# Patient Record
Sex: Male | Born: 1990 | Race: Black or African American | Hispanic: No | Marital: Single | Smoking: Former smoker
Health system: Southern US, Community
[De-identification: ages and names within clinical notes are randomized; demographics above are authoritative.]

## PROBLEM LIST (undated history)

## (undated) DIAGNOSIS — C4490 Unspecified malignant neoplasm of skin, unspecified: Secondary | ICD-10-CM

## (undated) DIAGNOSIS — M24411 Recurrent dislocation, right shoulder: Secondary | ICD-10-CM

---
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
## 2011-10-28 ENCOUNTER — Inpatient Hospital Stay: Payer: Self-pay | Admitting: Surgery

## 2011-10-28 LAB — COMPREHENSIVE METABOLIC PANEL
Albumin: 4.1 g/dL (ref 3.4–5.0)
Alkaline Phosphatase: 60 U/L (ref 50–136)
BUN: 10 mg/dL (ref 7–18)
Bilirubin,Total: 0.6 mg/dL (ref 0.2–1.0)
Calcium, Total: 9.3 mg/dL (ref 8.5–10.1)
Chloride: 106 mmol/L (ref 98–107)
Co2: 23 mmol/L (ref 21–32)
Creatinine: 0.88 mg/dL (ref 0.60–1.30)
Glucose: 94 mg/dL (ref 65–99)
Osmolality: 278 (ref 275–301)
Potassium: 3.8 mmol/L (ref 3.5–5.1)
Sodium: 140 mmol/L (ref 136–145)
Total Protein: 7.3 g/dL (ref 6.4–8.2)

## 2011-10-28 LAB — CBC
HCT: 47 % (ref 40.0–52.0)
HGB: 15.4 g/dL (ref 13.0–18.0)
MCH: 29.3 pg (ref 26.0–34.0)
MCHC: 32.7 g/dL (ref 32.0–36.0)
Platelet: 221 10*3/uL (ref 150–440)
RBC: 5.25 10*6/uL (ref 4.40–5.90)
WBC: 17 10*3/uL — ABNORMAL HIGH (ref 3.8–10.6)

## 2011-10-28 LAB — URINALYSIS, COMPLETE
Bilirubin,UR: NEGATIVE
Ph: 8 (ref 4.5–8.0)
Protein: NEGATIVE
Specific Gravity: 1.015 (ref 1.003–1.030)
Squamous Epithelial: NONE SEEN

## 2011-10-28 LAB — DRUG SCREEN, URINE
Barbiturates, Ur Screen: NEGATIVE (ref ?–200)
MDMA (Ecstasy)Ur Screen: NEGATIVE (ref ?–500)
Phencyclidine (PCP) Ur S: NEGATIVE (ref ?–25)

## 2011-11-02 LAB — PATHOLOGY REPORT

## 2014-03-19 ENCOUNTER — Ambulatory Visit: Payer: Self-pay | Admitting: Physician Assistant

## 2014-03-23 ENCOUNTER — Ambulatory Visit: Payer: Self-pay | Admitting: Physician Assistant

## 2014-03-24 ENCOUNTER — Emergency Department: Payer: Self-pay | Admitting: Emergency Medicine

## 2014-03-25 ENCOUNTER — Emergency Department: Payer: Self-pay | Admitting: Emergency Medicine

## 2014-03-25 ENCOUNTER — Ambulatory Visit: Payer: Self-pay

## 2014-03-25 LAB — CBC WITH DIFFERENTIAL/PLATELET
Basophil #: 0.1 10*3/uL (ref 0.0–0.1)
Basophil %: 0.9 %
EOS ABS: 0.4 10*3/uL (ref 0.0–0.7)
EOS PCT: 4.3 %
HCT: 41.7 % (ref 40.0–52.0)
HGB: 13.6 g/dL (ref 13.0–18.0)
Lymphocyte #: 2.6 10*3/uL (ref 1.0–3.6)
Lymphocyte %: 25.8 %
MCH: 29.1 pg (ref 26.0–34.0)
MCHC: 32.7 g/dL (ref 32.0–36.0)
MCV: 89 fL (ref 80–100)
Monocyte #: 1.6 x10 3/mm — ABNORMAL HIGH (ref 0.2–1.0)
Monocyte %: 15.5 %
NEUTROS ABS: 5.5 10*3/uL (ref 1.4–6.5)
Neutrophil %: 53.5 %
Platelet: 198 10*3/uL (ref 150–440)
RBC: 4.68 10*6/uL (ref 4.40–5.90)
RDW: 13.7 % (ref 11.5–14.5)
WBC: 10.2 10*3/uL (ref 3.8–10.6)

## 2014-03-25 LAB — WOUND CULTURE

## 2014-06-03 ENCOUNTER — Encounter: Payer: Self-pay | Admitting: Surgery

## 2014-06-13 ENCOUNTER — Ambulatory Visit: Payer: Self-pay | Admitting: Surgery

## 2014-06-19 ENCOUNTER — Ambulatory Visit: Payer: Self-pay | Admitting: Surgery

## 2014-06-19 LAB — DRUG SCREEN, URINE
AMPHETAMINES, UR SCREEN: NEGATIVE (ref ?–1000)
Barbiturates, Ur Screen: NEGATIVE (ref ?–200)
Benzodiazepine, Ur Scrn: NEGATIVE (ref ?–200)
CANNABINOID 50 NG, UR ~~LOC~~: POSITIVE (ref ?–50)
Cocaine Metabolite,Ur ~~LOC~~: NEGATIVE (ref ?–300)
MDMA (Ecstasy)Ur Screen: NEGATIVE (ref ?–500)
Methadone, Ur Screen: NEGATIVE (ref ?–300)
Opiate, Ur Screen: NEGATIVE (ref ?–300)
Phencyclidine (PCP) Ur S: NEGATIVE (ref ?–25)
TRICYCLIC, UR SCREEN: NEGATIVE (ref ?–1000)

## 2014-11-02 NOTE — Op Note (Signed)
PATIENT NAME:  [name], Joshua Foley MR#:  [id number] DATE OF BIRTH:  04/04/1991  DATE OF PROCEDURE:  06/19/2014  PREOPERATIVE DIAGNOSIS:  Left skin lesion, lower extremity.   POSTOPERATIVE DIAGNOSIS:  Left skin lesion, lower extremity.   OPERATION: Wide excision of the left lower extremity skin lesion.   ANESTHESIA: General.   SURGEON: Rodena Goldmann III, MD.   OPERATIVE PROCEDURE: With the patient in the supine position after the induction of appropriate general anesthesia, the patient's left lower extremity was prepped with Betadine and draped in sterile towels.   An elliptical incision was made with a 2 cm margin. The mass was lifted without difficulty. Hemostasis was achieved with the Bovie. The specimen was swept off the fascia. The major veins were identified and avoided. Skin nerves were identified and avoided. The mass was marked and sent for permanent section.   Because of the extent of the excision and the fact that we are uncertain as to the etiology, I did not attempt a closure, anticipating a future skin graft. The wound was covered with Adaptic, wet cotton balls, dressing sponges, Kerlix, and an Ace wrap. The patient was return to the recovery room, having tolerated the procedure well. Sponge and needle counts were correct x2 in the operating room.     ____________________________ Rodena Goldmann III, MD rle:kl D: 06/19/2014 10:50:36 ET Foley: 06/19/2014 13:28:28 ET JOB#: [id number]  cc: Rodena Goldmann III, MD, <Dictator> Rodena Goldmann MD ELECTRONICALLY SIGNED 06/19/2014 16:47

## 2014-11-03 NOTE — H&P (Signed)
PATIENT NAME:  Joshua Foley, Joshua Foley MR#:  [id number] DATE OF BIRTH:  12/27/1990  DATE OF ADMISSION:  10/28/2011  PRIMARY CARE PHYSICIAN: None.   ADMITTING PHYSICIAN: Dr. Pat Patrick   CHIEF COMPLAINT: Abdominal pain.   BRIEF HISTORY: Joshua Foley is a 24 year old gentleman with a 12 to 18-hour history of abdominal pain. The pain began as a midepigastric periumbilical pain, which migrated to the right lower quadrant over the course of several hours. He woke in the middle of night with severe pain, mild nausea, but did not vomit. He denies any previous similar symptoms. He presented to the Emergency Room for further evaluation of unrelenting pain and white blood cell count was noted to be 17,000. The surgical service was consulted.   PAST MEDICAL HISTORY/ PAST SURGICAL HISTORY: He has no other major medical problems. He has no history of hepatitis, yellow jaundice, pancreatitis, peptic ulcer disease, gallbladder disease, or diverticulitis. No history of cardiac disease, hypertension, or diabetes. No previous surgery.   MEDICATIONS: He takes no medications regularly.   ALLERGIES: No known medical allergies.   SOCIAL HISTORY: He drinks alcohol on an occasional basis, smokes marijuana occasionally, and smokes a pack of cigarettes a day.   FAMILY HISTORY: Noncontributory.   REVIEW OF SYSTEMS: Otherwise unremarkable.   PHYSICAL EXAMINATION:  GENERAL: He is an alert, pleasant gentleman in no significant distress.   VITAL SIGNS: Stable. Blood pressure is 110/68, heart rate 84 and regular, and oxygen saturation is satisfactory.   HEENT: No scleral icterus and he has normal equally round pupils.   NECK: Supple without adenopathy. Trachea is midline.   CHEST: Clear with no adventitious sounds. He has normal pulmonary excursion.   CARDIAC: No murmurs or gallops to my ear. He seems to be in normal sinus rhythm.   ABDOMEN: Markedly tender in the right lower quadrant with point tenderness, rebound and  guarding, jiggle tenderness. No masses. No hernias. He does have hypoactive but present bowel sounds.   EXTREMITIES: Lower extremity exam reveals no deformity, full range of motion, and normal pulses.   PSYCHIATRIC: Normal affect and normal orientation.   IMPRESSION: This gentleman likely has acute appendicitis.  His clinical examination is very supportive and I do not think CT scan would be of any benefit.  If  the CT scan was not conclusive, I do not think it would dissuade me from wanting to consider laparoscopy. We talked to the family and the patient about this plan and they are in agreement. We will anticipate surgical intervention later this morning.    ____________________________ Micheline Maze, MD rle:bjt D: 10/28/2011 09:27:12 ET T: 10/28/2011 09:46:24 ET JOB#: [id number]  cc: Rodena Goldmann III, MD, <Dictator> Rodena Goldmann MD ELECTRONICALLY SIGNED 11/05/2011 22:20

## 2014-11-03 NOTE — Op Note (Signed)
PATIENT NAME:  Joshua Foley, Joshua Foley MR#:  [id number] DATE OF BIRTH:  [birth year]  DATE OF PROCEDURE:  10/28/2011  PREOPERATIVE DIAGNOSIS: Acute appendicitis.  POSTOPERATIVE DIAGNOSIS: Acute appendicitis.  OPERATION: Laparoscopic appendectomy.   SURGEON: Rodena Goldmann, III, MD   ANESTHESIA: General.   DESCRIPTION OF PROCEDURE: With the patient in supine position after induction of appropriate anesthesia, the patient's abdomen was prepped with ChloraPrep and draped with sterile towels. The patient was placed in head down, feet up position.  A small infraumbilical incision was made in standard fashion and carried down bluntly through subcutaneous tissue.  A Veress needle was used to cannulate the peritoneal cavity. CO2 was insufflated to appropriate pressure measurements. When approximately 2.5 liters of CO2 were instilled, the Veress needle was withdrawn. An 11 mm [employer] Port was inserted into the peritoneal cavity. A transverse mid epigastric incision was made, and an 11 mm port was inserted under direct vision. The right lower quadrant was investigated and suppurative appendicitis was encountered. There did not appear to be any evidence of appendiceal rupture. A transverse suprapubic incision was made. The bladder was quite distended, and care was taken to avoid any injury to the bladder. The camera was moved to the upper port and dissection carried out through the two lower ports. The mesoappendix was divided with three applications of the EndoGIA stapler carrying a white load. The base of the appendix was divided with a single application of the EndoGIA stapler carrying a blue load. The appendix was captured in an EndoCatch apparatus and removed without difficulty. The area was copiously suctioned and irrigated with 2 liters of warm saline solution. No significant bleeding was visualized. There did not appear to be evidence of significant inflammatory change remaining. The lower midline port was  removed and the defect closed with a needle passer using 0 Vicryl. The abdomen was then desufflated. All ports were withdrawn without difficulty. Skin incisions were closed with 5-0 nylon. The area was infiltrated with 0.25% Marcaine for postoperative pain control. Sterile dressings were applied. A Foley catheter was then placed in and out and about 450 mL of urine drained from the patient's bladder.   The patient was returned to the recovery room having tolerated the procedure well. Sponge, needle, and instrument counts were correct x2 in the Operating Room.    ____________________________ Rodena Goldmann III, MD rle:cbb D: 10/28/2011 13:59:40 ET T: 10/28/2011 16:53:01 ET JOB#: [id number]  cc: Rodena Goldmann III, MD, <Dictator> Rodena Goldmann MD ELECTRONICALLY SIGNED 11/05/2011 22:20

## 2014-11-04 LAB — SURGICAL PATHOLOGY

## 2015-01-13 ENCOUNTER — Emergency Department
Admission: EM | Admit: 2015-01-13 | Discharge: 2015-01-13 | Disposition: A | Discharge status: Home / Self Care | Payer: Managed Care, Other (non HMO) | Attending: Emergency Medicine | Admitting: Emergency Medicine

## 2015-01-13 ENCOUNTER — Emergency Department: Payer: Managed Care, Other (non HMO)

## 2015-01-13 ENCOUNTER — Encounter: Payer: Self-pay | Admitting: *Deleted

## 2015-01-13 DIAGNOSIS — S43014A Anterior dislocation of right humerus, initial encounter: Secondary | ICD-10-CM | POA: Diagnosis not present

## 2015-01-13 DIAGNOSIS — Y9289 Other specified places as the place of occurrence of the external cause: Secondary | ICD-10-CM | POA: Insufficient documentation

## 2015-01-13 DIAGNOSIS — Y9389 Activity, other specified: Secondary | ICD-10-CM | POA: Insufficient documentation

## 2015-01-13 DIAGNOSIS — S43034A Inferior dislocation of right humerus, initial encounter: Secondary | ICD-10-CM | POA: Insufficient documentation

## 2015-01-13 DIAGNOSIS — Y998 Other external cause status: Secondary | ICD-10-CM | POA: Insufficient documentation

## 2015-01-13 DIAGNOSIS — Z87828 Personal history of other (healed) physical injury and trauma: Secondary | ICD-10-CM

## 2015-01-13 DIAGNOSIS — Z720 Tobacco use: Secondary | ICD-10-CM | POA: Diagnosis not present

## 2015-01-13 DIAGNOSIS — S4991XA Unspecified injury of right shoulder and upper arm, initial encounter: Secondary | ICD-10-CM | POA: Diagnosis present

## 2015-01-13 DIAGNOSIS — Z8739 Personal history of other diseases of the musculoskeletal system and connective tissue: Secondary | ICD-10-CM

## 2015-01-13 DIAGNOSIS — S43004A Unspecified dislocation of right shoulder joint, initial encounter: Secondary | ICD-10-CM

## 2015-01-13 HISTORY — DX: Unspecified malignant neoplasm of skin, unspecified: C44.90

## 2015-01-13 MED ORDER — DIAZEPAM 2 MG PO TABS: 2.0000 mg | ORAL_TABLET | Freq: Three times a day (TID) | ORAL | Status: DC | PRN

## 2015-01-13 MED ORDER — ONDANSETRON 4 MG PO TBDP
4.0000 mg | ORAL_TABLET | Freq: Once | ORAL | Status: AC
  Administered 2015-01-13: 4 mg via ORAL

## 2015-01-13 MED ORDER — KETOROLAC TROMETHAMINE 10 MG PO TABS: 10.0000 mg | ORAL_TABLET | Freq: Three times a day (TID) | ORAL | Status: DC

## 2015-01-13 MED ORDER — MORPHINE SULFATE 2 MG/ML IJ SOLN
INTRAMUSCULAR | Status: AC
  Administered 2015-01-13: 2 mg via INTRAMUSCULAR
  Filled 2015-01-13: qty 1

## 2015-01-13 MED ORDER — MORPHINE SULFATE 2 MG/ML IJ SOLN
2.0000 mg | Freq: Once | INTRAMUSCULAR | Status: AC
  Administered 2015-01-13: 2 mg via INTRAMUSCULAR

## 2015-01-13 MED ORDER — OXYCODONE-ACETAMINOPHEN 5-325 MG PO TABS: 1.0000 | ORAL_TABLET | Freq: Three times a day (TID) | ORAL | Status: DC | PRN

## 2015-01-13 MED ORDER — ONDANSETRON 4 MG PO TBDP
ORAL_TABLET | ORAL | Status: AC
  Filled 2015-01-13: qty 1

## 2015-01-13 NOTE — ED Notes (Signed)
Right shoulder reduced by jenise pa-c.  Tolerated well.  meds given again.
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
## 2015-01-13 NOTE — Discharge Instructions (Signed)
Shoulder Dislocation Your shoulder is made up of three bones: the collar bone (clavicle); the shoulder blade (scapula), which includes the socket (glenoid cavity); and the upper arm bone (humerus). Your shoulder joint is the place where these bones meet. Strong, fibrous tissues hold these bones together (ligaments). Muscles and strong, fibrous tissues that connect the muscles to these bones (tendons) allow your arm to move through this joint. The range of motion of your shoulder joint is more extensive than most of your other joints, and the glenoid cavity is very shallow. That is the reason that your shoulder joint is one of the most unstable joints in your body. It is far more prone to dislocation than your other joints. Shoulder dislocation is when your humerus is forced out of your shoulder joint. CAUSES Shoulder dislocation is caused by a forceful impact on your shoulder. This impact usually is from an injury, such as a sports injury or a fall. SYMPTOMS Symptoms of shoulder dislocation include:  Deformity of your shoulder.  Intense pain.  Inability to move your shoulder joint.  Numbness, weakness, or tingling around your shoulder joint (your neck or down your arm).  Bruising or swelling around your shoulder. DIAGNOSIS In order to diagnose a dislocated shoulder, your caregiver will perform a physical exam. Your caregiver also may have an X-ray exam done to see if you have any broken bones. Magnetic resonance imaging (MRI) is a procedure that sometimes is done to help your caregiver see any damage to the soft tissues around your shoulder, particularly your rotator cuff tendons. Additionally, your caregiver also may have electromyography done to measure the electrical discharges produced in your muscles if you have signs or symptoms of nerve damage. TREATMENT A shoulder dislocation is treated by placing the humerus back in the joint (reduction). Your caregiver does this either manually (closed  reduction), by moving your humerus back into the joint through manipulation, or through surgery (open reduction). When your humerus is back in place, severe pain should improve almost immediately. You also may need to have surgery if you have a weak shoulder joint or ligaments, and you have recurring shoulder dislocations, despite rehabilitation. In rare cases, surgery is necessary if your nerves or blood vessels are damaged during the dislocation. After your reduction, your arm will be placed in a shoulder immobilizer or sling to keep it from moving. Your caregiver will have you wear your shoulder immobilizer or sling for 3 days to 3 weeks, depending on how serious your dislocation is. When your shoulder immobilizer or sling is removed, your caregiver may prescribe physical therapy to help improve the range of motion in your shoulder joint. HOME CARE INSTRUCTIONS  The following measures can help to reduce pain and speed up the healing process:  Rest your injured joint. Do not move it. Avoid activities similar to the one that caused your injury.  Apply ice to your injured joint for the first day or two after your reduction or as directed by your caregiver. Applying ice helps to reduce inflammation and pain.  Put ice in a plastic bag.  Place a towel between your skin and the bag.  Leave the ice on for 15-20 minutes at a time, every 2 hours while you are awake.  Exercise your hand by squeezing a soft ball. This helps to eliminate stiffness and swelling in your hand and wrist.  Take over-the-counter or prescription medicine for pain or discomfort as told by your caregiver. SEEK IMMEDIATE MEDICAL CARE IF:   Your  shoulder immobilizer or sling becomes damaged.  Your pain becomes worse rather than better.  You lose feeling in your arm or hand, or they become white and cold. MAKE SURE YOU:   Understand these instructions.  Will watch your condition.  Will get help right away if you are not  doing well or get worse. Document Released: 03/23/2001 Document Revised: 11/12/2013 Document Reviewed: 04/18/2011 [facility] Patient Information 2015 [place], [place]. This information is not intended to replace advice given to you by your health care provider. Make sure you discuss any questions you have with your health care provider.  Your shoulder was successfully reduced after it was traumatically dislocated today.  Take the pain medicines as directed.  Apply regularly, and remain in the sling when out of bed. Follow-up with Ortho next week for further care and management.
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
## 2016-12-22 ENCOUNTER — Emergency Department: Payer: Self-pay

## 2016-12-22 ENCOUNTER — Emergency Department
Admission: EM | Admit: 2016-12-22 | Discharge: 2016-12-22 | Disposition: A | Discharge status: Home / Self Care | Payer: Self-pay | Attending: Emergency Medicine | Admitting: Emergency Medicine

## 2016-12-22 ENCOUNTER — Encounter: Payer: Self-pay | Admitting: *Deleted

## 2016-12-22 DIAGNOSIS — Y929 Unspecified place or not applicable: Secondary | ICD-10-CM | POA: Insufficient documentation

## 2016-12-22 DIAGNOSIS — X509XXA Other and unspecified overexertion or strenuous movements or postures, initial encounter: Secondary | ICD-10-CM | POA: Insufficient documentation

## 2016-12-22 DIAGNOSIS — Y999 Unspecified external cause status: Secondary | ICD-10-CM | POA: Insufficient documentation

## 2016-12-22 DIAGNOSIS — F17200 Nicotine dependence, unspecified, uncomplicated: Secondary | ICD-10-CM | POA: Insufficient documentation

## 2016-12-22 DIAGNOSIS — Z791 Long term (current) use of non-steroidal anti-inflammatories (NSAID): Secondary | ICD-10-CM | POA: Insufficient documentation

## 2016-12-22 DIAGNOSIS — Z85828 Personal history of other malignant neoplasm of skin: Secondary | ICD-10-CM | POA: Insufficient documentation

## 2016-12-22 DIAGNOSIS — Y939 Activity, unspecified: Secondary | ICD-10-CM | POA: Insufficient documentation

## 2016-12-22 DIAGNOSIS — S43014A Anterior dislocation of right humerus, initial encounter: Secondary | ICD-10-CM | POA: Insufficient documentation

## 2016-12-22 MED ORDER — HYDROCODONE-ACETAMINOPHEN 5-325 MG PO TABS: 1.0000 | ORAL_TABLET | ORAL | 0 refills | Status: DC | PRN

## 2016-12-22 MED ORDER — ONDANSETRON 4 MG PO TBDP
ORAL_TABLET | ORAL | Status: AC
  Filled 2016-12-22: qty 1

## 2016-12-22 MED ORDER — ONDANSETRON 4 MG PO TBDP
4.0000 mg | ORAL_TABLET | Freq: Once | ORAL | Status: AC | PRN
  Administered 2016-12-22: 4 mg via ORAL

## 2016-12-22 MED ORDER — HYDROMORPHONE HCL 1 MG/ML IJ SOLN
1.0000 mg | Freq: Once | INTRAMUSCULAR | Status: AC
  Administered 2016-12-22: 1 mg via INTRAMUSCULAR
  Filled 2016-12-22: qty 1

## 2016-12-22 NOTE — ED Provider Notes (Signed)
[facility] Emergency Department Provider Note  Time seen: 10:27 AM  I have reviewed the triage vital signs and the nursing notes.   HISTORY  Chief Complaint Shoulder Pain    HPI Joshua Foley is a 26 y.o. male with no past medical history who presents to the emergency department with a right shoulder dislocation. According to the patient he was stretching this morning when he felt a pop followed by significant pain in the right shoulder. Patient states a history of a prior dislocation 2 years ago. Denies any trauma. States severe pain in the right shoulder, otherwise negative review of systems.  Past Medical History:  Diagnosis Date  . Skin cancer     There are no active problems to display for this patient.   History reviewed. No pertinent surgical history.  Prior to Admission medications   Medication Sig Start Date End Date Taking? Authorizing Provider  diazepam (VALIUM) 2 MG tablet Take 1 tablet (2 mg total) by mouth every 8 (eight) hours as needed for muscle spasms. 01/13/15   Menshew, Dannielle Karvonen, PA-C  ketorolac (TORADOL) 10 MG tablet Take 1 tablet (10 mg total) by mouth every 8 (eight) hours. 01/13/15   Menshew, Dannielle Karvonen, PA-C  oxyCODONE-acetaminophen (ROXICET) 5-325 MG per tablet Take 1 tablet by mouth every 8 (eight) hours as needed. 01/13/15   Menshew, Dannielle Karvonen, PA-C    No Known Allergies  History reviewed. No pertinent family history.  Social History Social History  Substance Use Topics  . Smoking status: Current Every Day Smoker  . Smokeless tobacco: Not on file  . Alcohol use No    Review of Systems Constitutional: Negative for fever. Cardiovascular: Negative for chest pain. Respiratory: Negative for shortness of breath. Gastrointestinal: Negative for abdominal pain Genitourinary: Negative for dysuria. Musculoskeletal: Right shoulder pain Skin: Negative for rash. Neurological: Negative for headache All other ROS  negative  ____________________________________________   PHYSICAL EXAM:  VITAL SIGNS: ED Triage Vitals  Enc Vitals Group     BP 12/22/16 1016 (!) 146/62     Pulse Rate 12/22/16 1016 (!) 54     Resp 12/22/16 1016 16     Temp 12/22/16 1016 98.4 F (36.9 C)     Temp Source 12/22/16 1016 Oral     SpO2 12/22/16 1016 100 %     Weight 12/22/16 1014 200 lb (90.7 kg)     Height 12/22/16 1014 5\' 11"  (1.803 m)     Head Circumference --      Peak Flow --      Pain Score 12/22/16 1014 10     Pain Loc --      Pain Edu? --      Excl. in [place]? --     Constitutional: Alert and oriented. Mild distress due to pain. Eyes: Normal exam ENT   Head: Normocephalic and atraumatic.   Mouth/Throat: Mucous membranes are moist. Cardiovascular: Normal rate, regular rhythm. No murmur Respiratory: Normal respiratory effort without tachypnea nor retractions. Breath sounds are clear Gastrointestinal: Soft and nontender. No distention.  Musculoskeletal: Right shoulder pain with deformity/flattening, most consistent with anterior dislocation. Neurovascular intact distally Neurologic:  Normal speech and language. No gross focal neurologic deficits Skin:  Skin is warm, mild diaphoresis. Psychiatric: Mood and affect are normal  ____________________________________________     RADIOLOGY  X-ray confirms reduction. Possible Hill-Sachs lesion.  ____________________________________________   INITIAL IMPRESSION / ASSESSMENT AND PLAN / ED COURSE  Pertinent labs & imaging results  that were available during my care of the patient were reviewed by me and considered in my medical decision making (see chart for details).  Patient presents to the emergency department for right shoulder pain after stretching this morning, exam most consistent with anterior shoulder dislocation. Neurovascularly intact. Patient placed in a prone position on the stretcher, traction was applied to the right arm with scapular  manipulation and successful reduction. We will treat the patient's pain with IM Dilaudid, we'll obtain an x-ray to confirm successful reduction. Patient states he feels much better after reduction. Remains neurovascularly intact. We will place in a shoulder immobilizer.  Reduction of dislocation Date/Time: 10:30 AM Performed by: Harvest Dark Authorized by: Harvest Dark Consent: Verbal consent obtained. Risks and benefits: risks, benefits and alternatives were discussed Consent given by: patient Required items: required blood products, implants, devices, and special equipment available Time out: Immediately prior to procedure a "time out" was called to verify the correct patient, procedure, equipment, support staff and site/side marked as required.  Patient sedated: No  Vitals: Vital signs were monitored during sedation. Patient tolerance: Patient tolerated the procedure well with no immediate complications. Joint: Right shoulder Reduction technique: traction with scapular manipulation   X-ray confirms reduction. We will discharge and a shoulder immobilizer with pain medication and have the patient follow up with orthopedics in 1 week for recheck. ____________________________________________   FINAL CLINICAL IMPRESSION(S) / ED DIAGNOSES  Right shoulder dislocation    Harvest Dark, MD 12/22/16 1130

## 2016-12-22 NOTE — Discharge Instructions (Signed)
Please follow-up with orthopedics in 1 week for recheck/reevaluation. Please keep your arm in the shoulder immobilizer. He may take it off to shower, etc., however do not raise your arm past 90. Please take your pain medication as needed, as prescribed. Return to the emergency department for any increase in pain, or any other symptom personally concerning to yourself.

## 2016-12-22 NOTE — ED Triage Notes (Signed)
Pt states he was stretching this AM and heard his shoulder pop, states hx of dislocation last year

## 2016-12-22 NOTE — ED Notes (Signed)
Patient transported to X-ray 

## 2017-01-05 ENCOUNTER — Other Ambulatory Visit: Payer: Self-pay | Admitting: Orthopedic Surgery

## 2017-01-05 DIAGNOSIS — S43014A Anterior dislocation of right humerus, initial encounter: Secondary | ICD-10-CM

## 2017-01-13 ENCOUNTER — Ambulatory Visit: Payer: PRIVATE HEALTH INSURANCE

## 2017-01-13 ENCOUNTER — Ambulatory Visit: Payer: Managed Care, Other (non HMO)

## 2018-03-04 ENCOUNTER — Emergency Department: Payer: Commercial Managed Care - HMO

## 2018-03-04 ENCOUNTER — Encounter: Payer: Self-pay | Admitting: Emergency Medicine

## 2018-03-04 ENCOUNTER — Other Ambulatory Visit: Payer: Self-pay

## 2018-03-04 ENCOUNTER — Emergency Department
Admission: EM | Admit: 2018-03-04 | Discharge: 2018-03-04 | Disposition: A | Discharge status: Home / Self Care | Payer: Commercial Managed Care - HMO | Attending: Emergency Medicine | Admitting: Emergency Medicine

## 2018-03-04 DIAGNOSIS — F17210 Nicotine dependence, cigarettes, uncomplicated: Secondary | ICD-10-CM | POA: Diagnosis not present

## 2018-03-04 DIAGNOSIS — Y9389 Activity, other specified: Secondary | ICD-10-CM | POA: Insufficient documentation

## 2018-03-04 DIAGNOSIS — Y92003 Bedroom of unspecified non-institutional (private) residence as the place of occurrence of the external cause: Secondary | ICD-10-CM | POA: Diagnosis not present

## 2018-03-04 DIAGNOSIS — Z79899 Other long term (current) drug therapy: Secondary | ICD-10-CM | POA: Diagnosis not present

## 2018-03-04 DIAGNOSIS — Y999 Unspecified external cause status: Secondary | ICD-10-CM | POA: Diagnosis not present

## 2018-03-04 DIAGNOSIS — S4991XA Unspecified injury of right shoulder and upper arm, initial encounter: Secondary | ICD-10-CM | POA: Diagnosis present

## 2018-03-04 DIAGNOSIS — S43014A Anterior dislocation of right humerus, initial encounter: Secondary | ICD-10-CM | POA: Insufficient documentation

## 2018-03-04 DIAGNOSIS — X58XXXA Exposure to other specified factors, initial encounter: Secondary | ICD-10-CM | POA: Diagnosis not present

## 2018-03-04 MED ORDER — MORPHINE SULFATE (PF) 4 MG/ML IV SOLN
INTRAVENOUS | Status: AC
  Filled 2018-03-04: qty 1

## 2018-03-04 MED ORDER — TRAMADOL HCL 50 MG PO TABS: 50.0000 mg | ORAL_TABLET | Freq: Four times a day (QID) | ORAL | 0 refills | Status: AC | PRN

## 2018-03-04 MED ORDER — ONDANSETRON HCL 4 MG/2ML IJ SOLN
4.0000 mg | Freq: Once | INTRAMUSCULAR | Status: AC
  Administered 2018-03-04: 4 mg via INTRAVENOUS

## 2018-03-04 MED ORDER — ONDANSETRON HCL 4 MG/2ML IJ SOLN
INTRAMUSCULAR | Status: AC
  Filled 2018-03-04: qty 2

## 2018-03-04 MED ORDER — MORPHINE SULFATE (PF) 4 MG/ML IV SOLN
4.0000 mg | Freq: Once | INTRAVENOUS | Status: AC
  Administered 2018-03-04: 4 mg via INTRAVENOUS

## 2018-03-04 NOTE — ED Notes (Signed)
MD Owens Shark to bedside to reduce arm

## 2018-03-04 NOTE — ED Notes (Signed)
This RN reviewed discharge instructions, follow-up care, prescription, cryotherapy, and sling use with patient. Patient verbalized understanding of all reviewed information.  Patient stable, with no distress noted at this time.

## 2018-03-04 NOTE — ED Provider Notes (Signed)
[facility] Emergency Department Provider Note   First MD Initiated Contact with Patient 03/04/18 [phone]     (approximate)  I have reviewed the triage vital signs and the nursing notes.   HISTORY  Chief Complaint Shoulder Pain    HPI Joshua Foley is a 27 y.o. male issue of right shoulder dislocation presents to the emergency department acute onset of 10 out of 10 right shoulder pain and deformity while rolling over in bed tonight.  Patient states symptoms consistent with previous shoulder dislocation.   Past Medical History:  Diagnosis Date  . Skin cancer     There are no active problems to display for this patient.   History reviewed. No pertinent surgical history.  Prior to Admission medications   Medication Sig Start Date End Date Taking? Authorizing Provider  diazepam (VALIUM) 2 MG tablet Take 1 tablet (2 mg total) by mouth every 8 (eight) hours as needed for muscle spasms. 01/13/15   Menshew, Dannielle Karvonen, PA-C  HYDROcodone-acetaminophen (NORCO/VICODIN) 5-325 MG tablet Take 1 tablet by mouth every 4 (four) hours as needed. 12/22/16   Harvest Dark, MD  ketorolac (TORADOL) 10 MG tablet Take 1 tablet (10 mg total) by mouth every 8 (eight) hours. 01/13/15   Menshew, Dannielle Karvonen, PA-C  oxyCODONE-acetaminophen (ROXICET) 5-325 MG per tablet Take 1 tablet by mouth every 8 (eight) hours as needed. 01/13/15   Menshew, Dannielle Karvonen, PA-C    Allergies No known drug allergies History reviewed. No pertinent family history.  Social History Social History   Tobacco Use  . Smoking status: Current Every Day Smoker  Substance Use Topics  . Alcohol use: No  . Drug use: Not on file    Review of Systems Constitutional: No fever/chills Eyes: No visual changes. ENT: No sore throat. Cardiovascular: Denies chest pain. Respiratory: Denies shortness of breath. Gastrointestinal: No abdominal pain.  No nausea, no vomiting.  No diarrhea.  No  constipation. Genitourinary: Negative for dysuria. Musculoskeletal: Negative for neck pain.  Negative for back pain.  Positive for right shoulder pain Integumentary: Negative for rash. Neurological: Negative for headaches, focal weakness or numbness.   ____________________________________________   PHYSICAL EXAM:  VITAL SIGNS: ED Triage Vitals  Enc Vitals Group     BP 03/04/18 0436 120/68     Pulse Rate 03/04/18 0436 60     Resp 03/04/18 0436 18     Temp 03/04/18 0436 98.3 F (36.8 C)     Temp Source 03/04/18 0436 Oral     SpO2 03/04/18 0436 99 %     Weight 03/04/18 0437 102.1 kg (225 lb)     Height 03/04/18 0437 1.803 m (5\' 11" )     Head Circumference --      Peak Flow --      Pain Score 03/04/18 0437 8     Pain Loc --      Pain Edu? --      Excl. in [place]? --     Constitutional: Alert and oriented.  Apparent discomfort Eyes: Conjunctivae are normal.  Head: Atraumatic. Mouth/Throat: Mucous membranes are moist. Neck: No stridor.  Cardiovascular: Normal rate, regular rhythm. Good peripheral circulation. Grossly normal heart sounds. Respiratory: Normal respiratory effort.  No retractions. Lungs CTAB.  Musculoskeletal: Gross deformity of the right shoulder pain to motion limited secondary to pain. Neurologic:  Normal speech and language. No gross focal neurologic deficits are appreciated.  Skin:  Skin is warm, dry and intact. No rash noted.  Psychiatric: Mood and affect are normal. Speech and behavior are normal.  ________________ RADIOLOGY I, Tiburones N Sylas Twombly, personally viewed and evaluated these images (plain radiographs) as part of my medical decision making, as well as reviewing the written report by the radiologist.  ED MD interpretation: Right shoulder x-ray revealed anterior inferior shoulder dislocation  Official radiology report(s): No results found.    Reduction of dislocation Date/Time: 03/04/2018 5:19 AM Performed by: Gregor Hams, MD Authorized by:  Gregor Hams, MD  Consent: Verbal consent obtained. Consent given by: patient Patient identity confirmed: verbally with patient and arm band Local anesthesia used: no  Anesthesia: Local anesthesia used: no  Sedation: Patient sedated: no  Patient tolerance: Patient tolerated the procedure well with no immediate complications Comments: Shoulder reduction performed via scapular manipulation.      ____________________________________________   INITIAL IMPRESSION / ASSESSMENT AND PLAN / ED COURSE  As part of my medical decision making, I reviewed the following data within the electronic MEDICAL RECORD NUMBER   27 year old male presented with above-stated history and physical exam secondary to right shoulder dislocation.  Shoulder reduced via scapular manipulation without any difficulty.  Patient was given IV morphine 4 mg and for reduction.  ___________________________________  FINAL CLINICAL IMPRESSION(S) / ED DIAGNOSES  Final diagnoses:  Anterior dislocation of right shoulder, initial encounter     MEDICATIONS GIVEN DURING THIS VISIT:  Medications  morphine 4 MG/ML injection 4 mg (4 mg Intravenous Given 03/04/18 0458)  ondansetron (ZOFRAN) injection 4 mg (4 mg Intravenous Given 03/04/18 0458)     ED Discharge Orders    None       Note:  This document was prepared using Dragon voice recognition software and may include unintentional dictation errors.    Gregor Hams, MD 03/04/18 [phone]

## 2018-03-04 NOTE — ED Triage Notes (Signed)
Pt ambulatory to triage with no difficulty. Pt reports approximately 45 mins ago he rolled over in the bed and felt pain. Deformity to right shoulder noted. Hx of dislocation x 2 in the past.

## 2018-06-29 DIAGNOSIS — L209 Atopic dermatitis, unspecified: Secondary | ICD-10-CM | POA: Insufficient documentation

## 2019-09-13 DIAGNOSIS — S12600D Unspecified displaced fracture of seventh cervical vertebra, subsequent encounter for fracture with routine healing: Secondary | ICD-10-CM | POA: Insufficient documentation

## 2020-01-26 ENCOUNTER — Encounter: Payer: Self-pay | Admitting: Emergency Medicine

## 2020-01-26 ENCOUNTER — Emergency Department: Payer: 59

## 2020-01-26 ENCOUNTER — Other Ambulatory Visit: Payer: Self-pay

## 2020-01-26 ENCOUNTER — Emergency Department
Admission: EM | Admit: 2020-01-26 | Discharge: 2020-01-26 | Disposition: A | Discharge status: Home / Self Care | Payer: 59 | Attending: Emergency Medicine | Admitting: Emergency Medicine

## 2020-01-26 DIAGNOSIS — S43014A Anterior dislocation of right humerus, initial encounter: Secondary | ICD-10-CM | POA: Diagnosis not present

## 2020-01-26 DIAGNOSIS — Y999 Unspecified external cause status: Secondary | ICD-10-CM | POA: Insufficient documentation

## 2020-01-26 DIAGNOSIS — Y939 Activity, unspecified: Secondary | ICD-10-CM | POA: Diagnosis not present

## 2020-01-26 DIAGNOSIS — Y92410 Unspecified street and highway as the place of occurrence of the external cause: Secondary | ICD-10-CM | POA: Insufficient documentation

## 2020-01-26 DIAGNOSIS — Z87891 Personal history of nicotine dependence: Secondary | ICD-10-CM | POA: Insufficient documentation

## 2020-01-26 DIAGNOSIS — S4991XA Unspecified injury of right shoulder and upper arm, initial encounter: Secondary | ICD-10-CM | POA: Diagnosis present

## 2020-01-26 HISTORY — DX: Recurrent dislocation, right shoulder: M24.411

## 2020-01-26 MED ORDER — HYDROMORPHONE HCL 1 MG/ML IJ SOLN
INTRAMUSCULAR | Status: AC
  Administered 2020-01-26: 1 mg
  Filled 2020-01-26: qty 1

## 2020-01-26 MED ORDER — DOCUSATE SODIUM 100 MG PO CAPS: ORAL_CAPSULE | ORAL | 0 refills | Status: DC

## 2020-01-26 MED ORDER — HYDROMORPHONE HCL 1 MG/ML IJ SOLN: 1.0000 mg | INTRAMUSCULAR | Status: AC

## 2020-01-26 MED ORDER — MORPHINE SULFATE (PF) 4 MG/ML IV SOLN
4.0000 mg | Freq: Once | INTRAVENOUS | Status: AC
  Administered 2020-01-26: 4 mg via INTRAVENOUS
  Filled 2020-01-26: qty 1

## 2020-01-26 MED ORDER — MIDAZOLAM HCL 2 MG/2ML IJ SOLN
2.0000 mg | Freq: Once | INTRAMUSCULAR | Status: AC
  Administered 2020-01-26: 2 mg via INTRAVENOUS
  Filled 2020-01-26: qty 2

## 2020-01-26 MED ORDER — ONDANSETRON HCL 4 MG/2ML IJ SOLN
4.0000 mg | Freq: Once | INTRAMUSCULAR | Status: AC
Start: 2020-01-26 — End: 2020-01-26
  Administered 2020-01-26: 4 mg via INTRAVENOUS
  Filled 2020-01-26: qty 2

## 2020-01-26 MED ORDER — HYDROCODONE-ACETAMINOPHEN 5-325 MG PO TABS: 2.0000 | ORAL_TABLET | Freq: Four times a day (QID) | ORAL | 0 refills | Status: DC | PRN

## 2020-01-26 MED ORDER — HYDROMORPHONE HCL 1 MG/ML PO LIQD: 1.0000 mg | ORAL | Status: DC

## 2020-01-26 NOTE — ED Notes (Signed)
Family out to desk stating "someone needs to do something and get him some pain medicine." md notified, order for medication received.

## 2020-01-26 NOTE — ED Notes (Signed)
ED Provider at bedside. 

## 2020-01-26 NOTE — ED Notes (Signed)
Second weight added to patient's arm

## 2020-01-26 NOTE — ED Notes (Signed)
X-ray at bedside

## 2020-01-26 NOTE — ED Notes (Signed)
Patient placed in prone position on stretcher, with weight attached to arm per MD order. RN will continue to monitor.

## 2020-01-26 NOTE — ED Notes (Signed)
MD Karma Greaser and MD Charna Archer at bedside to reduce shoulder

## 2020-01-26 NOTE — Discharge Instructions (Signed)
You have been seen in the Emergency Department (ED) today for a shoulder dislocation.  It was put back in place in the ED.  Please follow up as written with the recommend orthopedic surgeon.  It is important you leave the shoulder immobilizer in place until they tell you to remove it, particularly given your history of recurrent dislocations.  Take Norco as prescribed for severe pain. Do not drink alcohol, drive or participate in any other potentially dangerous activities while taking this medication as it may make you sleepy. Do not take this medication with any other sedating medications, either prescription or over-the-counter. If you were prescribed Percocet or Vicodin, do not take these with acetaminophen (Tylenol) as it is already contained within these medications.   This medication is an opiate (or narcotic) pain medication and can be habit forming.  Use it as little as possible to achieve adequate pain control.  Do not use or use it with extreme caution if you have a history of opiate abuse or dependence.  If you are on a pain contract with your primary care doctor or a pain specialist, be sure to let them know you were prescribed this medication today from the [facility] Emergency Department.  This medication is intended for your use only - do not give any to anyone else and keep it in a secure place where nobody else, especially children, have access to it.  It will also cause or worsen constipation, so you may want to consider taking an over-the-counter stool softener while you are taking this medication.  If you have worsening pain or swelling, if the shoulder dislocates again, or if you have any other symptoms that concern you, please return immediately to the Emergency Department.
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
## 2020-01-26 NOTE — ED Triage Notes (Signed)
Patient states that he rolled over in his sleep and dislocated his right shoulder. Patient states that he has a history of the same.

## 2020-02-17 IMAGING — DX DG SHOULDER 2+V*R*
2 series · 2 of 2 positions shown · non-contrast
Comparison: RIGHT shoulder radiographs March 04, 2018 and RIGHT
shoulder radiograph December 22, 2016.

CLINICAL DATA: Postreduction.

EXAM:
RIGHT SHOULDER - 2+ VIEW

[shoulder ap]
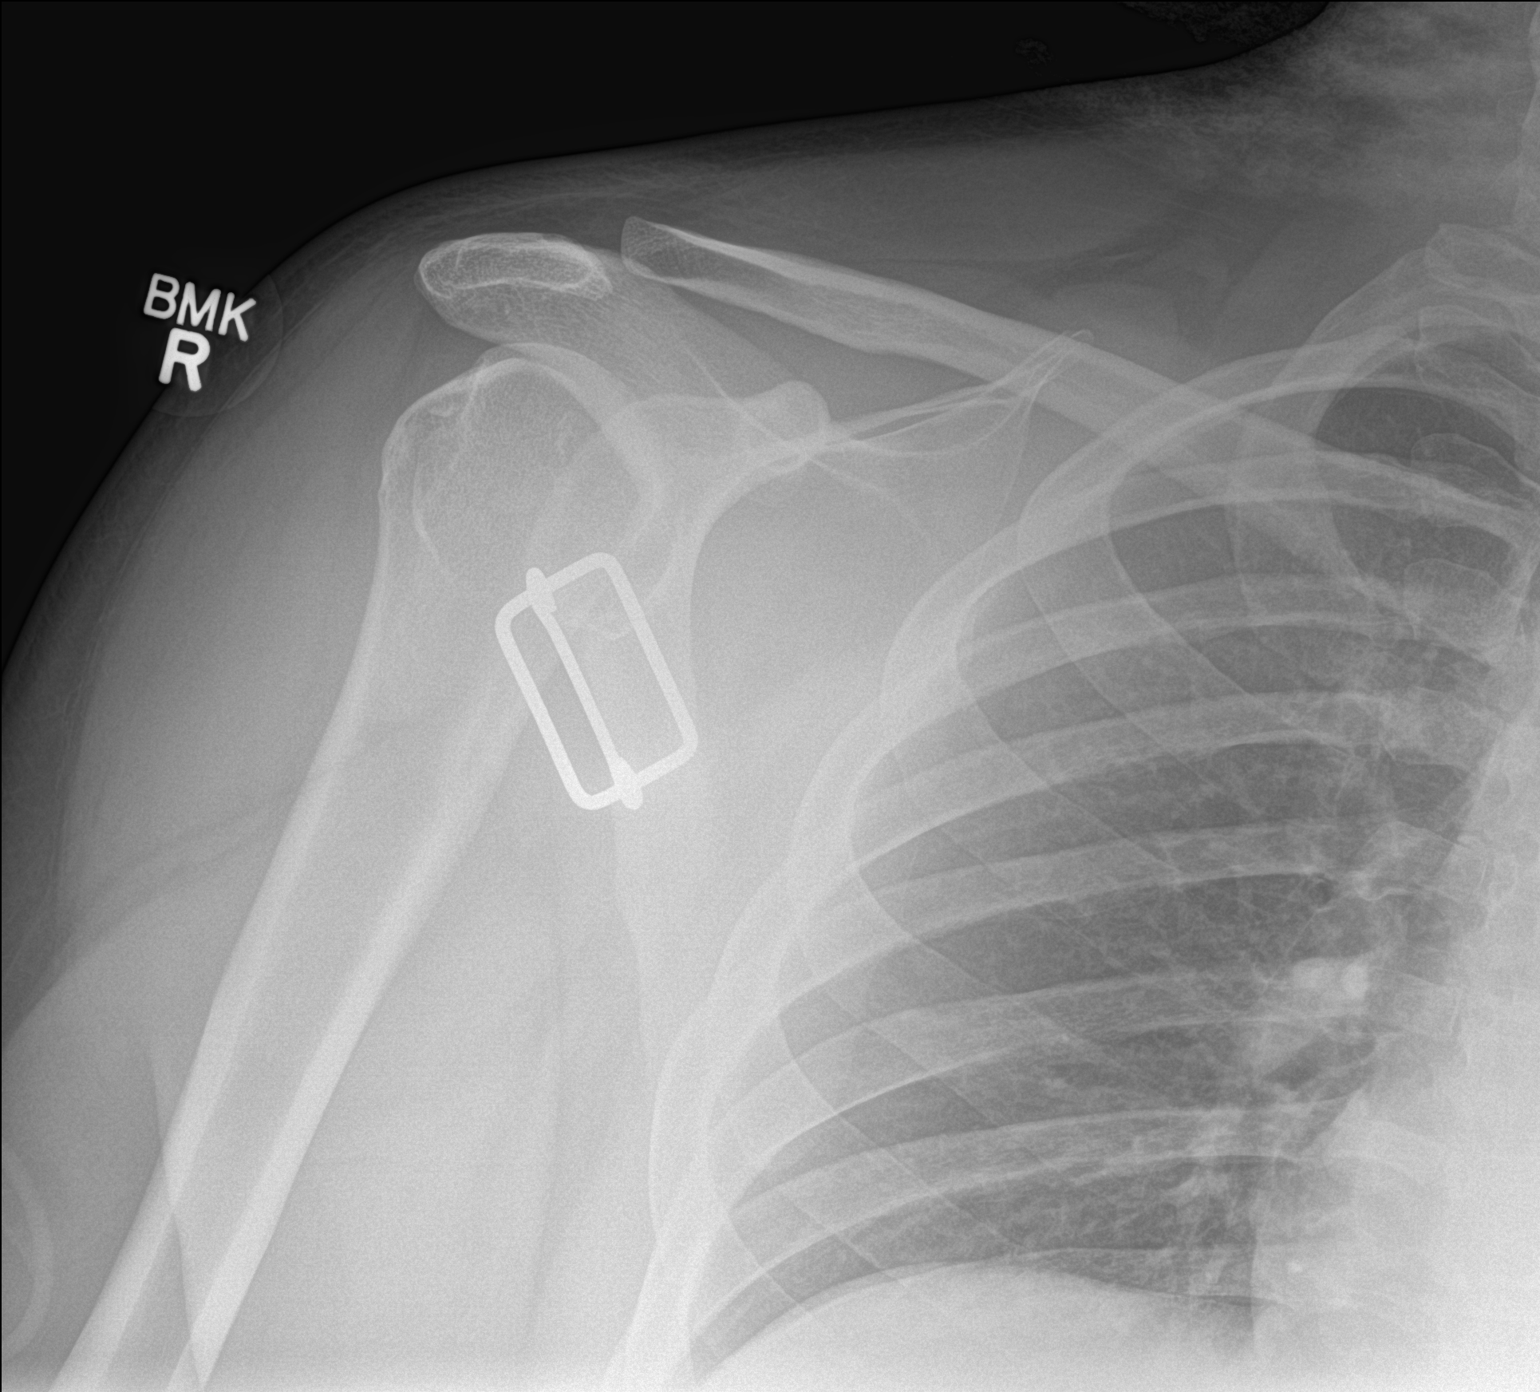

[shoulder obl]
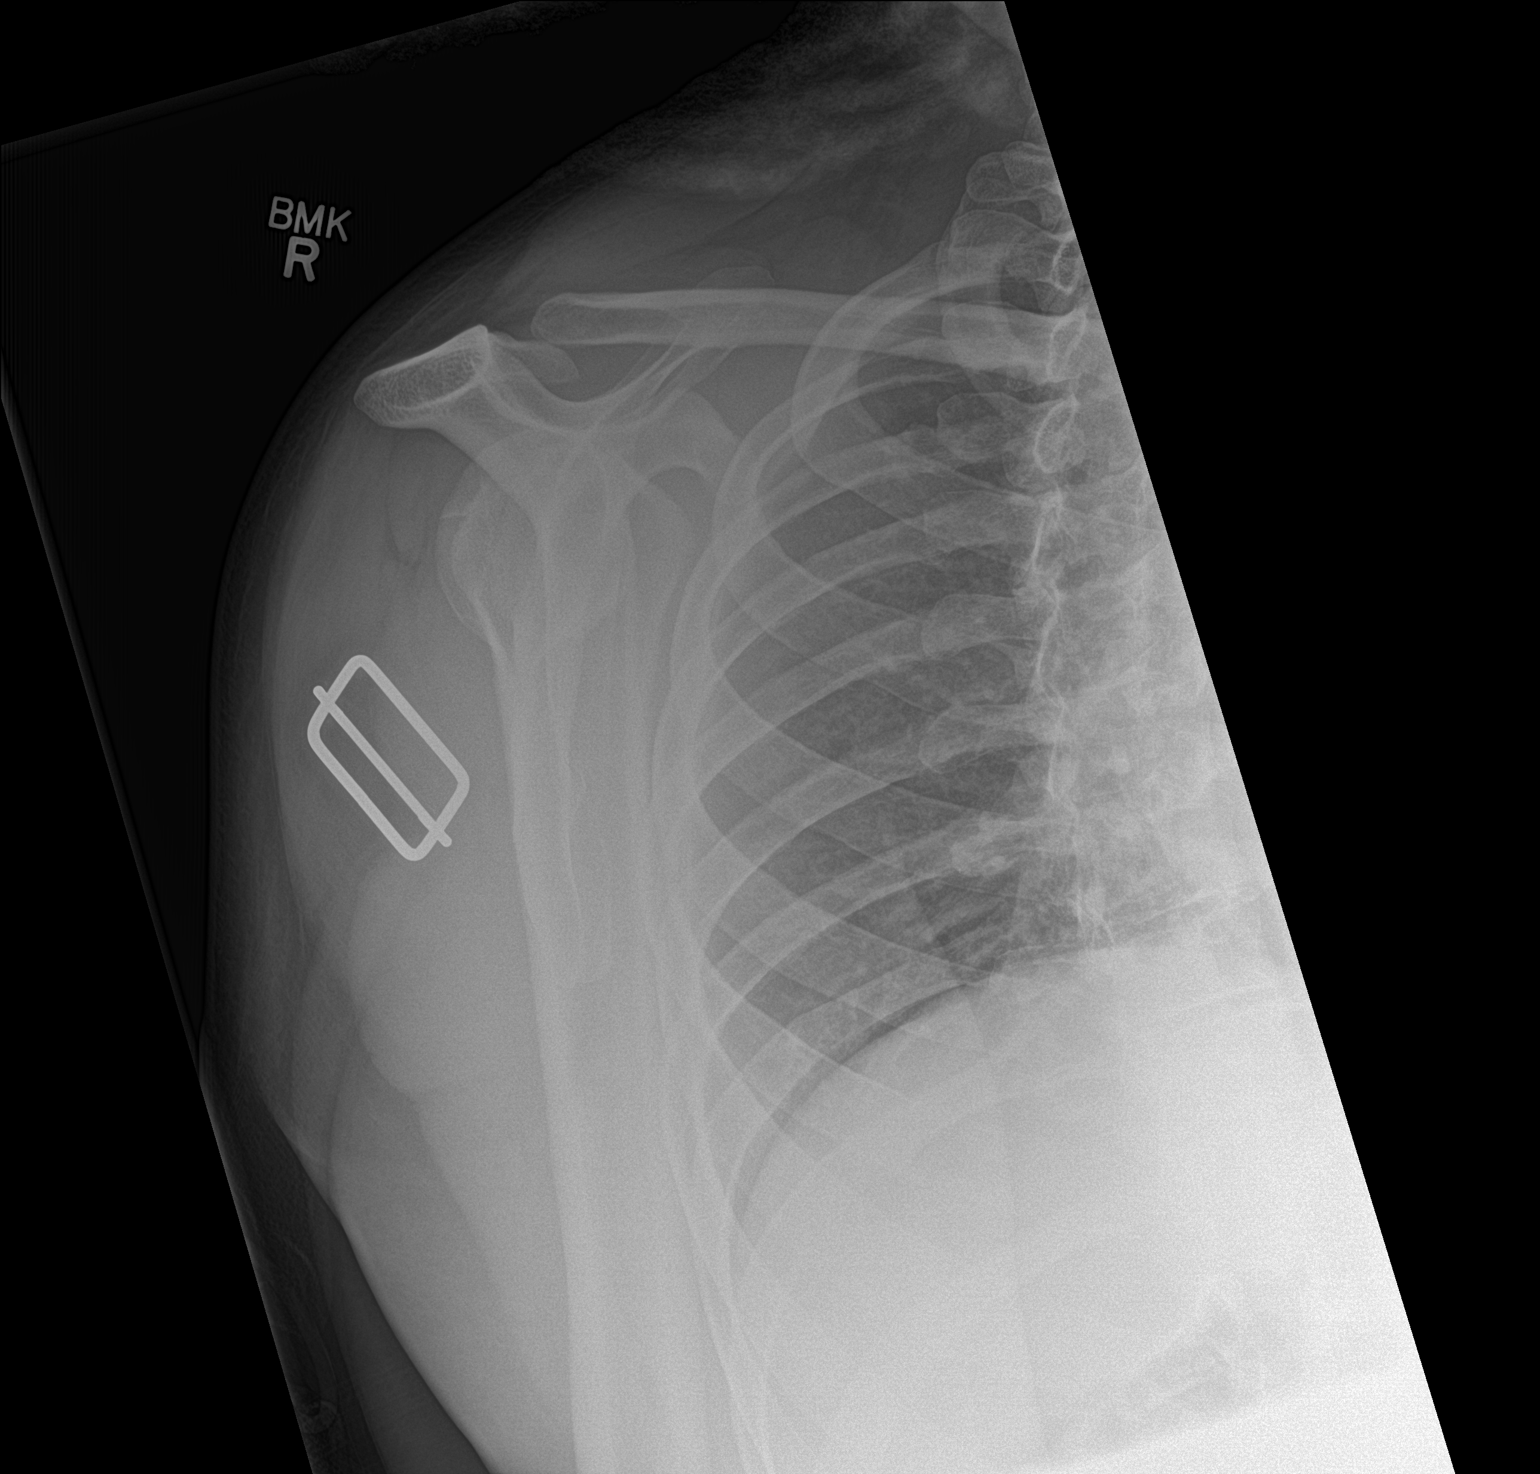

[2 of 2 positions shown; findings below may reference images not displayed]

FINDINGS: The humeral head is located, concave greater tuberosity. The
subacromial, glenohumeral and acromioclavicular joint spaces are
intact. No destructive bony lesions. Soft tissue planes are
non-suspicious.
IMPRESSION: 1. Successful closed reduction of shoulder dislocation.
2. Chronic Hill-Sachs deformity.

## 2020-08-01 ENCOUNTER — Other Ambulatory Visit: Payer: PRIVATE HEALTH INSURANCE

## 2022-01-10 IMAGING — DX DG SHOULDER 2+V PORT*R*
1 series · 1 of 1 positions shown · non-contrast
Comparison: Same day.

CLINICAL DATA: Status post reduction of dislocation.

EXAM:
PORTABLE RIGHT SHOULDER

[shoulder ap]
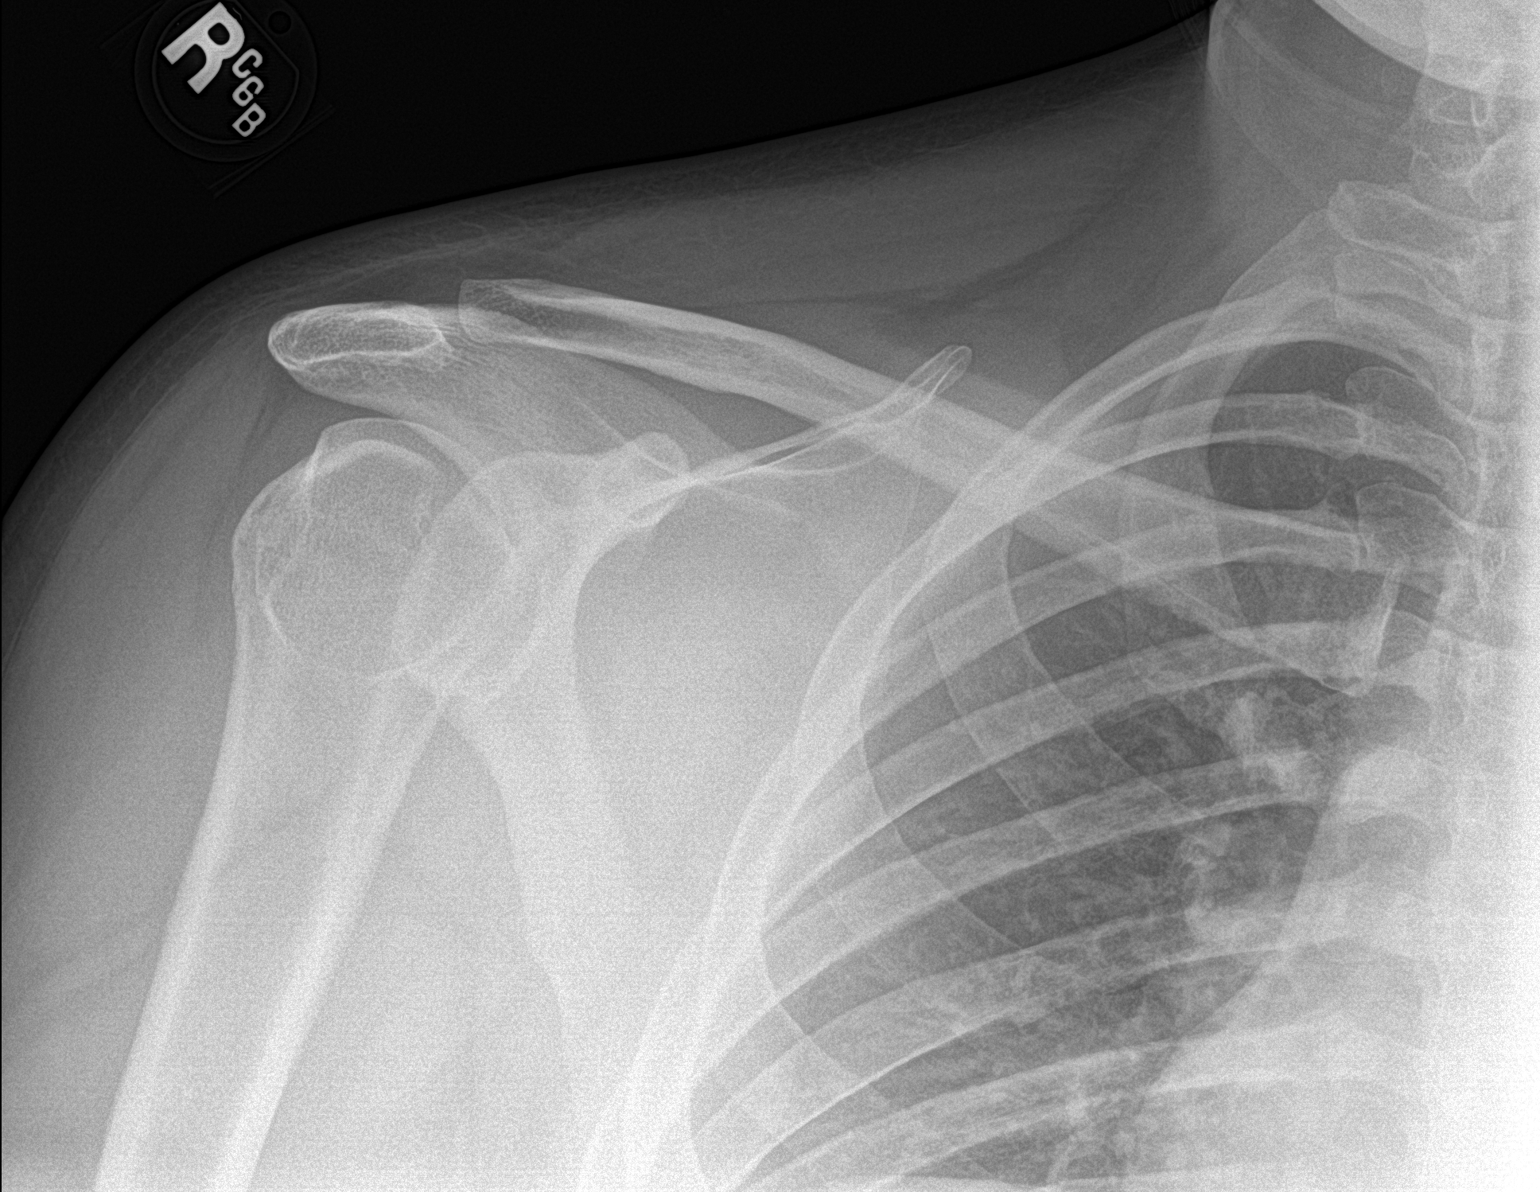

[1 of 1 positions shown; findings below may reference images not displayed]

FINDINGS: Anterior dislocation of right glenohumeral joint noted on prior exam
has been successfully reduced. No fracture is noted currently.
IMPRESSION: Successful reduction of anterior dislocation of right glenohumeral
joint.

## 2022-02-17 ENCOUNTER — Encounter: Payer: Self-pay | Admitting: *Deleted

## 2022-09-13 ENCOUNTER — Emergency Department: Payer: BC Managed Care – PPO

## 2022-09-13 ENCOUNTER — Encounter: Payer: Self-pay | Admitting: Emergency Medicine

## 2022-09-13 ENCOUNTER — Emergency Department
Admission: EM | Admit: 2022-09-13 | Discharge: 2022-09-13 | Disposition: A | Discharge status: Home / Self Care | Payer: BC Managed Care – PPO | Attending: Emergency Medicine | Admitting: Emergency Medicine

## 2022-09-13 ENCOUNTER — Other Ambulatory Visit: Payer: Self-pay

## 2022-09-13 DIAGNOSIS — S43004A Unspecified dislocation of right shoulder joint, initial encounter: Secondary | ICD-10-CM | POA: Diagnosis not present

## 2022-09-13 DIAGNOSIS — X501XXA Overexertion from prolonged static or awkward postures, initial encounter: Secondary | ICD-10-CM | POA: Diagnosis not present

## 2022-09-13 MED ORDER — ONDANSETRON HCL 4 MG/2ML IJ SOLN
4.0000 mg | Freq: Once | INTRAMUSCULAR | Status: AC
  Administered 2022-09-13: 4 mg via INTRAVENOUS
  Filled 2022-09-13: qty 2

## 2022-09-13 MED ORDER — HYDROMORPHONE HCL 1 MG/ML IJ SOLN
1.0000 mg | Freq: Once | INTRAMUSCULAR | Status: AC
  Administered 2022-09-13: 1 mg via INTRAVENOUS
  Filled 2022-09-13: qty 1

## 2022-09-13 NOTE — Discharge Instructions (Addendum)
You can take Tylenol 1 g every 8 hours and ibuprofen 600 every 6-8 hours with food to help with any pain.  Please call the orthopedic number to make a follow-up appointment and leave your arm in the sling and to get follow-up.  There are some concerns for some little bit of avulsions fractures that could be from the shoulder coming in and out so much but this can be followed up with the orthopedic doctors as we discussed  IMPRESSION:  1. The previously identified anterior dislocation has been reduced.  2. A linear focus of high attenuation is identified medial to the  proximal humeral diaphysis, possibly in the inferior joint, not seen  on previous studies. This could represent a small avulsion fracture  fragment or loose body.  3. An apparent soft tissue calcification lateral to the humeral head  on the transscapular Y-view may have been present but superimposed  over the humeral head on the March 04, 2018 study. This finding  suggests an age indeterminate fracture fragment or loose body.

## 2022-09-13 NOTE — ED Notes (Addendum)
Right large shoulder sling applied. Per MD Jari Pigg verbal order for repeat shoulder xray post shoulder reduction.

## 2022-09-13 NOTE — ED Triage Notes (Signed)
Pt to ED via POV c/o shoulder dislocation. Obvious abnormalities noted on the right shoulder. Pt states he was lifting dog food bags with one arm and it went too far back and shoulder dislocated.

## 2022-09-13 NOTE — ED Provider Notes (Signed)
[facility] Provider Note    Event Date/Time   First MD Initiated Contact with Patient 09/13/22 1819     (approximate)   History   Dislocation   HPI  Joshua Foley is a 32 y.o. male otherwise healthy who comes in with right shoulder dislocation.  Patient reports multiple episodes of shoulder dislocations over the past years.  This will be his fifth.  He reports lifting something up over his right shoulder and feeling it pop out of place.  Denies any falls.  He denies any other injuries.     Physical Exam   Triage Vital Signs: ED Triage Vitals  Enc Vitals Group     BP 09/13/22 1656 122/66     Pulse Rate 09/13/22 1656 (!) 59     Resp 09/13/22 1656 18     Temp 09/13/22 1656 98.3 F (36.8 C)     Temp Source 09/13/22 1653 Oral     SpO2 09/13/22 1656 100 %     Weight 09/13/22 1654 220 lb (99.8 kg)     Height 09/13/22 1654 '5\' 11"'$  (1.803 m)     Head Circumference --      Peak Flow --      Pain Score 09/13/22 1653 8     Pain Loc --      Pain Edu? --      Excl. in [place]? --     Most recent vital signs: Vitals:   09/13/22 1656  BP: 122/66  Pulse: (!) 59  Resp: 18  Temp: 98.3 F (36.8 C)  SpO2: 100%     General: Awake, no distress.  CV:  Good peripheral perfusion.  Resp:  Normal effort.  Abd:  No distention.  Other:  Deformity noted to the right shoulder neurovascularly intact   ED Results / Procedures / Treatments   RADIOLOGY I have reviewed the xray personally and interpreted and patient has a dislocated anterior shoulder   PROCEDURES:  Critical Care performed: No  Reduction of dislocation  Date/Time: 09/13/2022 8:07 PM  Performed by: Vanessa Wells, MD Authorized by: Vanessa [place], MD  Consent: Verbal consent obtained. Consent given by: patient Patient identity confirmed: verbally with patient Local anesthesia used: no  Anesthesia: Local anesthesia used: no  Sedation: Patient sedated: no  Patient tolerance: patient  tolerated the procedure well with no immediate complications      MEDICATIONS ORDERED IN ED: Medications  HYDROmorphone (DILAUDID) injection 1 mg (1 mg Intravenous Given 09/13/22 1831)  ondansetron (ZOFRAN) injection 4 mg (4 mg Intravenous Given 09/13/22 1831)     IMPRESSION / MDM / ASSESSMENT AND PLAN / ED COURSE  I reviewed the triage vital signs and the nursing notes.   Patient's presentation is most consistent with acute, uncomplicated illness.   Patient comes in with shoulder dislocation confirmed on x-ray.  Patient was given 1 mg of Dilaudid attempted fairs and Cunningham technique without reduction.  We then placed patient supine with gentle pulling the arm down with scapular manipulation with another provider and this was able to reduce the shoulder.  Repeat x-ray does show possible calcification and possible avulsion fracture.  Did discuss this with patient and he is aware of this.  He can follow-up with orthopedics outpatient.  Patient given their number for reference to call.  Patient remains neurovascularly intact on repeat evaluation reports feeling much better and feels comfortable with discharge home   FINAL CLINICAL IMPRESSION(S) / ED DIAGNOSES   Final diagnoses:  Dislocation of right shoulder joint, initial encounter     Rx / DC Orders   ED Discharge Orders     None        Note:  This document was prepared using Dragon voice recognition software and may include unintentional dictation errors.   Vanessa [place], MD 09/13/22 2009

## 2023-06-15 ENCOUNTER — Ambulatory Visit: Payer: BC Managed Care – PPO | Admitting: Urology

## 2023-06-15 VITALS — BP 126/71 | HR 60 | Ht 71.0 in | Wt 220.4 lb

## 2023-06-15 DIAGNOSIS — D0472 Carcinoma in situ of skin of left lower limb, including hip: Secondary | ICD-10-CM | POA: Insufficient documentation

## 2023-06-15 DIAGNOSIS — Z3009 Encounter for other general counseling and advice on contraception: Secondary | ICD-10-CM | POA: Diagnosis not present

## 2023-06-15 MED ORDER — DIAZEPAM 10 MG PO TABS: 10.0000 mg | ORAL_TABLET | Freq: Once | ORAL | 0 refills | Status: AC

## 2023-06-15 NOTE — Patient Instructions (Signed)
Pre-Vasectomy Instructions ? ?STOP all aspirin or blood thinners (Aspirin, Plavix, Coumadin, Warfarin, Motrin, Ibuprofen, Advil, Aleve, Naproxen, Naprosyn) for 7 days prior to the procedure.  If you have any questions about stopping these medications please contact your primary care physician or cardiologist. ? ?Shave all hair from the upper scrotum on the day of the procedure.  This means just under the penis onto the scrotal sac.  The area shaved should measure about 2-3 inches around.  You may lather the scrotum with soap and water, and shave with a safety razor. ? ?After shaving the area, thoroughly wash the penis and the scrotum, then shower or bathe to remove all the loose hairs.  If needed, wash the area again just before coming in for your Vasectomy. ? ?It is recommended to have a light meal an hour or so prior to the procedure. ? ?Bring a scrotal support (jock strap or suspensory, or tight jockey shorts or underwear).  Wear comfortable pants or shorts. ? ?While the actual procedure usually takes about 45 minutes, you should be prepared to stay in the office for approximately one hour.  Bring someone with you to drive you home. ? ?If you have any questions or concerns, please feel free to call the office at [phone]. ?  ?

## 2023-06-15 NOTE — Progress Notes (Signed)
Marcelle Overlie Plume,acting as a scribe for Vanna Scotland, MD.,have documented all relevant documentation on the behalf of Vanna Scotland, MD,as directed by  Vanna Scotland, MD while in the presence of Vanna Scotland, MD.  06/15/23 9:51 AM   Leotis Shames 1990/07/26 [id number]  Referring provider: [facility] [address] [place],  [place] [address]  Chief Complaint  Patient presents with   Establish Care   VAS Consult    HPI: 32 y.o. year old male referred for further evaluation of possible vasectomy.  He denies a history of testicular trauma or pain.  No urinary issues.  No previous scrotal surgeries.  He has 2 children, ages 2 and 4. He and his partner desire no further biological pregnancies.   PMH: Past Medical History:  Diagnosis Date   Recurrent shoulder dislocation, right    Skin cancer      Home Medications:  Allergies as of 06/15/2023   No Known Allergies      Medication List        Accurate as of June 15, 2023  9:51 AM. If you have any questions, ask your nurse or doctor.          STOP taking these medications    diazepam 2 MG tablet Commonly known as: Valium   docusate sodium 100 MG capsule Commonly known as: Colace   HYDROcodone-acetaminophen 5-325 MG tablet Commonly known as: NORCO/VICODIN   ketorolac 10 MG tablet Commonly known as: TORADOL   oxyCODONE-acetaminophen 5-325 MG tablet Commonly known as: Roxicet        Allergies: No Known Allergies  Family History: No family history on file.  Social History:  reports that he has quit smoking. He has never used smokeless tobacco. He reports that he does not drink alcohol. No history on file for drug use.   Physical Exam: BP 126/71   Pulse 60   Ht 5\' 11"  (1.803 m)   Wt 220 lb 6 oz (100 kg)   BMI 30.74 kg/m   Constitutional:  Alert and oriented, No acute distress. HEENT: [place] AT, moist mucus membranes.  Trachea midline, no masses. Cardiovascular: No clubbing,  cyanosis, or edema. Respiratory: Normal respiratory effort, no increased work of breathing. GI: Abdomen is soft, nontender, nondistended, no abdominal masses GU: Normal phallus.  Bilateral descended testicles without masses.  Vasa easily palpable bilaterally. Skin: No rashes, bruises or suspicious lesions. Neurologic: Grossly intact, no focal deficits, moving all 4 extremities. Psychiatric: Normal mood and affect.   Assessment & Plan:    1. Vasectomy evaluation Today, we discussed what the vas deferens is, where it is located, and its function. We reviewed the procedure for vasectomy, it's risks, benefits, alternatives, and likelihood of achieving his goals. We discussed in detail the procedure, complications, and recovery as well as the need for clearance prior to unprotected intercourse. We discussed that vasectomy does not protect against sexually transmitted diseases. We discussed that this procedure does not result in immediate sterility and that they would need to use other forms of birth control until he has been cleared with negative postvasectomy semen analyses. I explained that the procedure is considered to be permanent and that attempts at reversal have varying degrees of success. These options include vasectomy reversal, sperm retrieval, and in vitro fertilization; these can be very expensive. We discussed the chance of postvasectomy pain syndrome which occurs in less than 5% of patients. I explained to the patient that there is no treatment to resolve this  chronic pain, and that if it developed I would not be able to help resolve the issue, but that surgery is generally not needed for correction. I explained there have even been reports of systemic like illness associated with this chronic pain, and that there was no good cure. I explained that vasectomy it is not a 100% reliable form of birth control, and the risk of pregnancy after vasectomy is approximately 1 in 2000 men who had a  negative postvasectomy semen analysis or rare non-motile sperm. I explained that repeat vasectomy was necessary in less than 1% of vasectomy procedures when employing the type of technique that I use. I explained that he should refrain from ejaculation for approximately one week following vasectomy. I explained that there are other options for birth control which are permanent and non-permanent; we discussed these. I explained the rates of surgical complications, such as symptomatic hematoma or infection, are low (1-2%) and vary with the surgeon's experience and criteria used to diagnose the complication.   The patient had the opportunity to ask questions to his stated satisfaction. He voiced understanding of the above factors and stated that he has read all the information provided to him and the packets and informed consent.  He is interested in receiving of Valium 10 mg prior to the procedure for the purpose of anxiolysis.  A prescription was given today.  He will have a driver on the day of the procedure.   Return for vasectomy.  I have reviewed the above documentation for accuracy and completeness, and I agree with the above.   Vanna Scotland, MD     [facility] [address] [place], [place] [address] [phone]

## 2023-07-22 ENCOUNTER — Encounter: Payer: BC Managed Care – PPO | Admitting: Urology

## 2023-08-24 ENCOUNTER — Encounter: Payer: BC Managed Care – PPO | Admitting: Urology

## 2023-08-26 ENCOUNTER — Encounter: Payer: Self-pay | Admitting: Urology

## 2023-12-14 ENCOUNTER — Encounter: Admitting: Urology

## 2023-12-15 ENCOUNTER — Encounter: Admitting: Urology

## 2023-12-27 ENCOUNTER — Encounter: Admitting: Urology

## 2023-12-27 DIAGNOSIS — Z9852 Vasectomy status: Secondary | ICD-10-CM
